# Patient Record
Sex: Male | Born: 1945 | Race: Black or African American | Hispanic: No | Marital: Married | Smoking: Never smoker
Health system: Southern US, Community
[De-identification: ages and names within clinical notes are randomized; demographics above are authoritative.]

## PROBLEM LIST (undated history)

## (undated) DIAGNOSIS — I10 Essential (primary) hypertension: Secondary | ICD-10-CM

## (undated) DIAGNOSIS — E119 Type 2 diabetes mellitus without complications: Secondary | ICD-10-CM

## (undated) DIAGNOSIS — N289 Disorder of kidney and ureter, unspecified: Secondary | ICD-10-CM

## (undated) HISTORY — PX: NO PAST SURGERIES: SHX2092

---

## 2008-08-30 ENCOUNTER — Ambulatory Visit: Payer: Self-pay | Admitting: Radiology

## 2008-08-30 ENCOUNTER — Emergency Department (HOSPITAL_BASED_OUTPATIENT_CLINIC_OR_DEPARTMENT_OTHER): Admission: EM | Admit: 2008-08-30 | Discharge: 2008-08-30 | Payer: Self-pay | Admitting: Emergency Medicine

## 2013-10-19 ENCOUNTER — Other Ambulatory Visit: Payer: Self-pay | Admitting: Internal Medicine

## 2013-10-19 ENCOUNTER — Ambulatory Visit
Admission: RE | Admit: 2013-10-19 | Discharge: 2013-10-19 | Disposition: A | Discharge status: Home / Self Care | Payer: Medicare Other | Source: Ambulatory Visit | Attending: Internal Medicine | Admitting: Internal Medicine

## 2013-10-19 DIAGNOSIS — R0602 Shortness of breath: Secondary | ICD-10-CM

## 2013-11-20 ENCOUNTER — Emergency Department (HOSPITAL_COMMUNITY): Payer: Medicare Other

## 2013-11-20 ENCOUNTER — Inpatient Hospital Stay (HOSPITAL_COMMUNITY)
Admission: EM | Admit: 2013-11-20 | Discharge: 2013-11-23 | DRG: 194 | Disposition: A | Discharge status: Home / Self Care | Payer: Medicare Other | Attending: Internal Medicine | Admitting: Internal Medicine

## 2013-11-20 ENCOUNTER — Encounter (HOSPITAL_COMMUNITY): Payer: Self-pay | Admitting: Emergency Medicine

## 2013-11-20 DIAGNOSIS — E119 Type 2 diabetes mellitus without complications: Secondary | ICD-10-CM | POA: Diagnosis present

## 2013-11-20 DIAGNOSIS — J189 Pneumonia, unspecified organism: Principal | ICD-10-CM

## 2013-11-20 DIAGNOSIS — Z7982 Long term (current) use of aspirin: Secondary | ICD-10-CM

## 2013-11-20 DIAGNOSIS — D649 Anemia, unspecified: Secondary | ICD-10-CM

## 2013-11-20 DIAGNOSIS — E785 Hyperlipidemia, unspecified: Secondary | ICD-10-CM | POA: Diagnosis present

## 2013-11-20 DIAGNOSIS — N19 Unspecified kidney failure: Secondary | ICD-10-CM

## 2013-11-20 DIAGNOSIS — N039 Chronic nephritic syndrome with unspecified morphologic changes: Secondary | ICD-10-CM

## 2013-11-20 DIAGNOSIS — I10 Essential (primary) hypertension: Secondary | ICD-10-CM

## 2013-11-20 DIAGNOSIS — D631 Anemia in chronic kidney disease: Secondary | ICD-10-CM | POA: Diagnosis present

## 2013-11-20 DIAGNOSIS — N185 Chronic kidney disease, stage 5: Secondary | ICD-10-CM | POA: Diagnosis present

## 2013-11-20 DIAGNOSIS — I120 Hypertensive chronic kidney disease with stage 5 chronic kidney disease or end stage renal disease: Secondary | ICD-10-CM | POA: Diagnosis present

## 2013-11-20 HISTORY — DX: Disorder of kidney and ureter, unspecified: N28.9

## 2013-11-20 HISTORY — DX: Essential (primary) hypertension: I10

## 2013-11-20 HISTORY — DX: Type 2 diabetes mellitus without complications: E11.9

## 2013-11-20 LAB — CBC WITH DIFFERENTIAL/PLATELET
Basophils Absolute: 0 10*3/uL (ref 0.0–0.1)
Basophils Relative: 0 % (ref 0–1)
EOS ABS: 0.1 10*3/uL (ref 0.0–0.7)
EOS PCT: 2 % (ref 0–5)
HEMATOCRIT: 25.6 % — AB (ref 39.0–52.0)
Hemoglobin: 8.2 g/dL — ABNORMAL LOW (ref 13.0–17.0)
LYMPHS ABS: 0.9 10*3/uL (ref 0.7–4.0)
LYMPHS PCT: 13 % (ref 12–46)
MCH: 31.5 pg (ref 26.0–34.0)
MCHC: 32 g/dL (ref 30.0–36.0)
MCV: 98.5 fL (ref 78.0–100.0)
MONO ABS: 0.8 10*3/uL (ref 0.1–1.0)
Monocytes Relative: 11 % (ref 3–12)
Neutro Abs: 5.3 10*3/uL (ref 1.7–7.7)
Neutrophils Relative %: 74 % (ref 43–77)
Platelets: 144 10*3/uL — ABNORMAL LOW (ref 150–400)
RBC: 2.6 MIL/uL — AB (ref 4.22–5.81)
RDW: 15.8 % — AB (ref 11.5–15.5)
WBC: 7.1 10*3/uL (ref 4.0–10.5)

## 2013-11-20 LAB — HEPATIC FUNCTION PANEL
ALBUMIN: 3.5 g/dL (ref 3.5–5.2)
ALT: 19 U/L (ref 0–53)
AST: 25 U/L (ref 0–37)
Alkaline Phosphatase: 39 U/L (ref 39–117)
BILIRUBIN TOTAL: 0.4 mg/dL (ref 0.3–1.2)
Bilirubin, Direct: <0.2 mg/dL (ref 0.0–0.3)
TOTAL PROTEIN: 6.8 g/dL (ref 6.0–8.3)

## 2013-11-20 LAB — URINALYSIS W MICROSCOPIC (NOT AT ARMC)
BILIRUBIN URINE: NEGATIVE
GLUCOSE, UA: NEGATIVE mg/dL
HGB URINE DIPSTICK: NEGATIVE
Ketones, ur: NEGATIVE mg/dL
Leukocytes, UA: NEGATIVE
Nitrite: NEGATIVE
PH: 5 (ref 5.0–8.0)
Protein, ur: 100 mg/dL — AB
SPECIFIC GRAVITY, URINE: 1.016 (ref 1.005–1.030)
Urobilinogen, UA: 0.2 mg/dL (ref 0.0–1.0)

## 2013-11-20 LAB — BASIC METABOLIC PANEL
Anion gap: 14 (ref 5–15)
BUN: 78 mg/dL — ABNORMAL HIGH (ref 6–23)
CALCIUM: 9.3 mg/dL (ref 8.4–10.5)
CO2: 20 meq/L (ref 19–32)
CREATININE: 5.16 mg/dL — AB (ref 0.50–1.35)
Chloride: 111 mEq/L (ref 96–112)
GFR calc Af Amer: 12 mL/min — ABNORMAL LOW (ref >90)
GFR calc non Af Amer: 10 mL/min — ABNORMAL LOW (ref >90)
GLUCOSE: 123 mg/dL — AB (ref 70–99)
Potassium: 5 mEq/L (ref 3.7–5.3)
Sodium: 145 mEq/L (ref 137–147)

## 2013-11-20 LAB — PROCALCITONIN: Procalcitonin: 0.12 ng/mL

## 2013-11-20 LAB — PRO B NATRIURETIC PEPTIDE: Pro B Natriuretic peptide (BNP): 9650 pg/mL — ABNORMAL HIGH (ref 0–125)

## 2013-11-20 LAB — I-STAT CG4 LACTIC ACID, ED: Lactic Acid, Venous: 0.54 mmol/L (ref 0.5–2.2)

## 2013-11-20 LAB — CK: CK TOTAL: 162 U/L (ref 7–232)

## 2013-11-20 LAB — TROPONIN I: Troponin I: <0.3 ng/mL (ref <0.30)

## 2013-11-20 LAB — LACTATE DEHYDROGENASE: LDH: 273 U/L — AB (ref 94–250)

## 2013-11-20 MED ORDER — DEXTROSE 5 % IV SOLN: 1.0000 g | Freq: Once | INTRAVENOUS | Status: DC

## 2013-11-20 MED ORDER — AMLODIPINE BESYLATE 10 MG PO TABS
10.0000 mg | ORAL_TABLET | Freq: Every day | ORAL | Status: DC
  Administered 2013-11-21 – 2013-11-23 (×3): 10 mg via ORAL
  Filled 2013-11-20 (×3): qty 1

## 2013-11-20 MED ORDER — ENOXAPARIN SODIUM 40 MG/0.4ML ~~LOC~~ SOLN
40.0000 mg | Freq: Every day | SUBCUTANEOUS | Status: DC
  Administered 2013-11-21 – 2013-11-22 (×3): 40 mg via SUBCUTANEOUS
  Filled 2013-11-20 (×4): qty 0.4

## 2013-11-20 MED ORDER — ACETAMINOPHEN 325 MG PO TABS: 650.0000 mg | ORAL_TABLET | Freq: Four times a day (QID) | ORAL | Status: DC | PRN

## 2013-11-20 MED ORDER — ATORVASTATIN CALCIUM 20 MG PO TABS
20.0000 mg | ORAL_TABLET | Freq: Every day | ORAL | Status: DC
  Administered 2013-11-21 – 2013-11-23 (×3): 20 mg via ORAL
  Filled 2013-11-20 (×3): qty 1

## 2013-11-20 MED ORDER — IPRATROPIUM BROMIDE 0.02 % IN SOLN
0.5000 mg | Freq: Once | RESPIRATORY_TRACT | Status: AC
  Administered 2013-11-20: 0.5 mg via RESPIRATORY_TRACT
  Filled 2013-11-20: qty 2.5

## 2013-11-20 MED ORDER — CLONIDINE HCL 0.3 MG PO TABS
0.3000 mg | ORAL_TABLET | Freq: Two times a day (BID) | ORAL | Status: DC
  Administered 2013-11-21 – 2013-11-23 (×6): 0.3 mg via ORAL
  Filled 2013-11-20 (×7): qty 1

## 2013-11-20 MED ORDER — SODIUM CHLORIDE 0.9 % IJ SOLN
3.0000 mL | Freq: Two times a day (BID) | INTRAMUSCULAR | Status: DC
Start: 2013-11-20 — End: 2013-11-23
  Administered 2013-11-20 – 2013-11-23 (×6): 3 mL via INTRAVENOUS

## 2013-11-20 MED ORDER — RENA-VITE PO TABS
1.0000 | ORAL_TABLET | Freq: Every day | ORAL | Status: DC
  Administered 2013-11-21 – 2013-11-23 (×3): 1 via ORAL
  Filled 2013-11-20 (×3): qty 1

## 2013-11-20 MED ORDER — INSULIN ASPART 100 UNIT/ML ~~LOC~~ SOLN
0.0000 [IU] | Freq: Three times a day (TID) | SUBCUTANEOUS | Status: DC
Start: 2013-11-21 — End: 2013-11-23
  Administered 2013-11-21 – 2013-11-22 (×3): 1 [IU] via SUBCUTANEOUS

## 2013-11-20 MED ORDER — ACETAMINOPHEN 650 MG RE SUPP: 650.0000 mg | Freq: Four times a day (QID) | RECTAL | Status: DC | PRN

## 2013-11-20 MED ORDER — LINAGLIPTIN 5 MG PO TABS
5.0000 mg | ORAL_TABLET | Freq: Every day | ORAL | Status: DC
  Administered 2013-11-21 – 2013-11-23 (×3): 5 mg via ORAL
  Filled 2013-11-20 (×3): qty 1

## 2013-11-20 MED ORDER — ALBUTEROL SULFATE (2.5 MG/3ML) 0.083% IN NEBU
5.0000 mg | INHALATION_SOLUTION | Freq: Once | RESPIRATORY_TRACT | Status: AC
  Administered 2013-11-20: 5 mg via RESPIRATORY_TRACT
  Filled 2013-11-20: qty 6

## 2013-11-20 MED ORDER — FUROSEMIDE 40 MG PO TABS
40.0000 mg | ORAL_TABLET | Freq: Two times a day (BID) | ORAL | Status: DC
  Administered 2013-11-21 (×2): 40 mg via ORAL
  Filled 2013-11-20 (×5): qty 1

## 2013-11-20 MED ORDER — SODIUM CHLORIDE 0.9 % IV BOLUS (SEPSIS): 1000.0000 mL | Freq: Once | INTRAVENOUS | Status: DC

## 2013-11-20 MED ORDER — LEVOFLOXACIN 500 MG PO TABS
500.0000 mg | ORAL_TABLET | Freq: Once | ORAL | Status: AC
  Administered 2013-11-20: 500 mg via ORAL
  Filled 2013-11-20: qty 1

## 2013-11-20 MED ORDER — SODIUM CHLORIDE 0.9 % IV SOLN
INTRAVENOUS | Status: DC
  Administered 2013-11-20: 23:00:00 via INTRAVENOUS

## 2013-11-20 MED ORDER — ASPIRIN 81 MG PO CHEW
81.0000 mg | CHEWABLE_TABLET | Freq: Every day | ORAL | Status: DC
  Administered 2013-11-21 – 2013-11-23 (×3): 81 mg via ORAL
  Filled 2013-11-20 (×3): qty 1

## 2013-11-20 MED ORDER — CARVEDILOL 25 MG PO TABS
50.0000 mg | ORAL_TABLET | Freq: Two times a day (BID) | ORAL | Status: DC
  Administered 2013-11-21 – 2013-11-23 (×6): 50 mg via ORAL
  Filled 2013-11-20 (×8): qty 2

## 2013-11-20 MED ORDER — ONDANSETRON HCL 4 MG/2ML IJ SOLN: 4.0000 mg | Freq: Four times a day (QID) | INTRAMUSCULAR | Status: DC | PRN

## 2013-11-20 MED ORDER — SODIUM CHLORIDE 0.9 % IV SOLN: INTRAVENOUS | Status: DC

## 2013-11-20 MED ORDER — ADULT MULTIVITAMIN W/MINERALS CH
1.0000 | ORAL_TABLET | Freq: Every day | ORAL | Status: DC
  Administered 2013-11-21 – 2013-11-23 (×3): 1 via ORAL
  Filled 2013-11-20 (×3): qty 1

## 2013-11-20 MED ORDER — LEVOFLOXACIN 250 MG PO TABS
250.0000 mg | ORAL_TABLET | ORAL | Status: DC
  Administered 2013-11-21 – 2013-11-23 (×3): 250 mg via ORAL
  Filled 2013-11-20 (×3): qty 1

## 2013-11-20 MED ORDER — ONDANSETRON HCL 4 MG PO TABS: 4.0000 mg | ORAL_TABLET | Freq: Four times a day (QID) | ORAL | Status: DC | PRN

## 2013-11-20 NOTE — ED Notes (Signed)
Pt states shortness of breath with exertion, cough, nasal congestion and chills since Monday.  Temp 99.8 today in triage.  Cough is nonproductive.  Pt denies pain/n/v on assessment.

## 2013-11-20 NOTE — ED Notes (Signed)
PA Greene at bedside. 

## 2013-11-20 NOTE — H&P (Signed)
[facility] History and Physical  Billy Aguilar [id number] DOB: 1945-06-09 DOA: 11/20/2013  Referring physician: ER physician. PCP: No primary provider on file. Dr. Othelia Pulling.  Chief Complaint: Cough and chest tightness.  HPI: Billy Aguilar is a 68 y.o. male history of chronic kidney disease stage V, anemia, diabetes mellitus, hypertension and hyperlipidemia has been experiencing upper respiratory tract infection-like symptoms over the last 4-5 days. Patient started having some nonproductive cough. He's been having some chest tightness of the last 2 days. He came to the ER and was found to have an x-ray showing possible atypical pneumonia versus fluid overload. On exam patient does not look to have any fluid overload. Patient's and is more consistent with possible pneumonia. Patient has been admitted for antibiotics. Patient's creatinine is around 5 and patient states his creatinine usually stays around 5. And hemoglobin around 8 which patient states is his baseline. Patient at this time denies any nausea vomiting abdominal pain. Has been having off and on fever chills.   Review of Systems: As presented in the history of presenting illness, rest negative.  Past Medical History  Diagnosis Date  . Hypertension   . Renal disorder   . Diabetes mellitus without complication    Past Surgical History  Procedure Laterality Date  . No past surgeries     Social History:  reports that he has never smoked. He has never used smokeless tobacco. He reports that he does not drink alcohol or use illicit drugs. Where does patient live home. Can patient participate in ADLs? Yes.  No Known Allergies  Family History:  Family History  Problem Relation Age of Onset  . Diabetes Mellitus II Other   . CAD Neg Hx       Prior to Admission medications   Medication Sig Start Date End Date Taking? Authorizing Provider  amLODipine (NORVASC) 10 MG tablet Take 10 mg by mouth daily.   Yes  Historical Provider, MD  aspirin 81 MG chewable tablet Chew 81 mg by mouth daily.   Yes Historical Provider, MD  atorvastatin (LIPITOR) 20 MG tablet Take 20 mg by mouth daily.   Yes Historical Provider, MD  benazepril (LOTENSIN) 40 MG tablet Take 40 mg by mouth daily.   Yes Historical Provider, MD  carvedilol (COREG) 25 MG tablet Take 50 mg by mouth 2 (two) times daily with a meal.   Yes Historical Provider, MD  cloNIDine (CATAPRES) 0.2 MG tablet Take 0.4 mg by mouth 2 (two) times daily.   Yes Historical Provider, MD  furosemide (LASIX) 40 MG tablet Take 40 mg by mouth 2 (two) times daily.   Yes Historical Provider, MD  linagliptin (TRADJENTA) 5 MG TABS tablet Take 5 mg by mouth daily.   Yes Historical Provider, MD  Multiple Vitamin (MULTIVITAMIN WITH MINERALS) TABS tablet Take 1 tablet by mouth daily.   Yes Historical Provider, MD  multivitamin (RENA-VIT) TABS tablet Take 1 tablet by mouth daily.   Yes Historical Provider, MD    Physical Exam: Filed Vitals:   11/20/13 1604  BP: 147/64  Pulse: 58  Temp: 99.8 F (37.7 C)  TempSrc: Oral  Resp: 18  SpO2: 96%     General:  Well-developed and nourished.  Eyes: Anicteric no pallor.  ENT: No discharge from the ears eyes nose mouth.  Neck: No mass felt. JVD are appreciated.  Cardiovascular: S1-S2 heard.  Respiratory: No rhonchi or crepitations.  Abdomen: Soft nontender bowel sounds present.  Skin: No rash.  Musculoskeletal: No edema.  Psychiatric: Appears normal.  Neurologic: Alert oriented to time place and person. Moves all extremities.  Labs on Admission:  Basic Metabolic Panel:  Recent Labs Lab 11/20/13 1929  NA 145  K 5.0  CL 111  CO2 20  GLUCOSE 123*  BUN 78*  CREATININE 5.16*  CALCIUM 9.3   Liver Function Tests: No results found for this basename: AST, ALT, ALKPHOS, BILITOT, PROT, ALBUMIN,  in the last 168 hours No results found for this basename: LIPASE, AMYLASE,  in the last 168 hours No results found  for this basename: AMMONIA,  in the last 168 hours CBC:  Recent Labs Lab 11/20/13 1929  WBC 7.1  NEUTROABS 5.3  HGB 8.2*  HCT 25.6*  MCV 98.5  PLT 144*   Cardiac Enzymes: No results found for this basename: CKTOTAL, CKMB, CKMBINDEX, TROPONINI,  in the last 168 hours  BNP (last 3 results)  Recent Labs  11/20/13 1929  PROBNP 9650.0*   CBG: No results found for this basename: GLUCAP,  in the last 168 hours  Radiological Exams on Admission: Dg Chest 2 View  11/20/2013   CLINICAL DATA:  Chest pain. Congestion. Cough. Shortness of breath. Hypertension.  EXAM: CHEST  2 VIEW  COMPARISON:  10/19/2013  FINDINGS: Stable moderate enlargement of the cardiopericardial silhouette. Tortuous thoracic aorta with atherosclerotic calcification of the arch.  Right perihilar airspace opacity. Equivocal left perihilar density. Upper zone pulmonary vascular prominence. No pleural effusion. Thoracic spondylosis noted.  IMPRESSION: 1. Right greater than left perihilar airspace opacities, differential diagnosis includes atypical pneumonia and mildly asymmetric pulmonary edema. 2. Stable moderate enlargement of the cardiopericardial silhouette.   Electronically Signed   By: Sherryl Barters M.D.   On: 11/20/2013 17:13    Assessment/Plan Active Problems:   Pneumonia   CKD (chronic kidney disease), stage V   HTN (hypertension)   Anemia   Diabetes mellitus type 2, controlled   1. Pneumonia - patient symptoms are more consistent with pneumonia rather than fluid overload. Patient has no peripheral edema and is lying on the bed flat. Patient has been placed on Levaquin. Check urine strep antigen and Legionella. Closely follow history status. 2. Chronic kidney disease stage V - patient states that he is already scheduled followup with transplant nephrologist. At this time I will continue patient's home medications except for ACE inhibitors. May try to discuss with patient's PCP in a.m. to check patient's  baseline creatinine. I have ordered one dose of IV Lasix. Closely follow intake output and metabolic panel. 3. Hypertension - see #2. 4. Diabetes mellitus type 2 - continue home medications with sliding-scale coverage. 5. Anemia probably from chronic kidney disease - patient states that he gets monthly injections of Epogen. He states his hemoglobin is usually on 8. 6. Hyperlipidemia - continue present medications.    Code Status: Full code.  Family Communication: Patient's daughter at the bedside.  Disposition Plan: Admit to inpatient.    Loann Chahal N. [facility] Pager [phone].  If 7PM-7AM, please contact night-coverage [email or web address] Password [email or web address] 11/20/2013, 9:07 PM

## 2013-11-20 NOTE — ED Provider Notes (Signed)
CSN: [id number]     Arrival date & time 11/20/13  1535 History   First MD Initiated Contact with Patient 11/20/13 1812     Chief Complaint  Patient presents with  . Nasal Congestion  . Cough     (Consider location/radiation/quality/duration/timing/severity/associated sxs/prior Treatment) HPI  Dr. Diona Foley (Family Practice- private)  Pt to the ER with complaints of myalgias, chest congestion, cough, nasal congestion and low grade fever over the past week. 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 He has not tried any medications at home or seen his PCP for this. He reports that his cough has been non productive and denies chest pain. Admits to a small amount of wheezing. NO nausea, vomiting, diarrhea, abdominal pain or dysuria. No recent fall or injury.   Past Medical History  Diagnosis Date  . Hypertension   . Renal disorder    History reviewed. No pertinent past surgical history. History reviewed. No pertinent family history. History  Substance Use Topics  . Smoking status: Never Smoker   . Smokeless tobacco: Not on file  . Alcohol Use: No    Review of Systems  Constitutional: Positive for fever and chills.  HENT: Positive for congestion.   Respiratory: Positive for cough and wheezing.   Musculoskeletal: Positive for myalgias.      Allergies  Review of patient's allergies indicates no known allergies.  Home Medications   Prior to Admission medications   Medication Sig Start Date End Date Taking? Authorizing Provider  amLODipine (NORVASC) 10 MG tablet Take 10 mg by mouth daily.   Yes Historical Provider, MD  aspirin 81 MG chewable tablet Chew 81 mg by mouth daily.   Yes Historical Provider, MD  atorvastatin (LIPITOR) 20 MG tablet Take 20 mg by mouth daily.   Yes Historical Provider, MD  benazepril (LOTENSIN) 40 MG tablet Take 40 mg by mouth daily.   Yes Historical Provider, MD  carvedilol  (COREG) 25 MG tablet Take 50 mg by mouth 2 (two) times daily with a meal.   Yes Historical Provider, MD  cloNIDine (CATAPRES) 0.2 MG tablet Take 0.4 mg by mouth 2 (two) times daily.   Yes Historical Provider, MD  furosemide (LASIX) 40 MG tablet Take 40 mg by mouth 2 (two) times daily.   Yes Historical Provider, MD  linagliptin (TRADJENTA) 5 MG TABS tablet Take 5 mg by mouth daily.   Yes Historical Provider, MD  Multiple Vitamin (MULTIVITAMIN WITH MINERALS) TABS tablet Take 1 tablet by mouth daily.   Yes Historical Provider, MD  multivitamin (RENA-VIT) TABS tablet Take 1 tablet by mouth daily.   Yes Historical Provider, MD   BP 147/64  Pulse 58  Temp(Src) 99.8 F (37.7 C) (Oral)  Resp 18  SpO2 96% Physical Exam  Nursing note and vitals reviewed. Constitutional: He appears well-developed and well-nourished. No distress.  HENT:  Head: Normocephalic and atraumatic.  Eyes: Pupils are equal, round, and reactive to light.  Neck: Normal range of motion. Neck supple.  Cardiovascular: Normal rate and regular rhythm.   Pulmonary/Chest: Effort normal. No accessory muscle usage. He has no decreased breath sounds. He has wheezes. He has rhonchi (RLL base). He has no rales. He exhibits no tenderness, no laceration, no edema and no retraction.  Abdominal: Soft.  Neurological: He is alert.  Skin: Skin is warm and dry.    ED Course  Procedures (including critical care time) Labs Review Labs  Reviewed  PRO B NATRIURETIC PEPTIDE - Abnormal; Notable for the following:    Pro B Natriuretic peptide (BNP) 9650.0 (*)    All other components within normal limits  CBC WITH DIFFERENTIAL - Abnormal; Notable for the following:    RBC 2.60 (*)    Hemoglobin 8.2 (*)    HCT 25.6 (*)    RDW 15.8 (*)    Platelets 144 (*)    All other components within normal limits  BASIC METABOLIC PANEL - Abnormal; Notable for the following:    Glucose, Bld 123 (*)    BUN 78 (*)    Creatinine, Ser 5.16 (*)    GFR calc non  Af Amer 10 (*)    GFR calc Af Amer 12 (*)    All other components within normal limits  I-STAT CG4 LACTIC ACID, ED    Imaging Review Dg Chest 2 View  11/20/2013   CLINICAL DATA:  Chest pain. Congestion. Cough. Shortness of breath. Hypertension.  EXAM: CHEST  2 VIEW  COMPARISON:  10/19/2013  FINDINGS: Stable moderate enlargement of the cardiopericardial silhouette. Tortuous thoracic aorta with atherosclerotic calcification of the arch.  Right perihilar airspace opacity. Equivocal left perihilar density. Upper zone pulmonary vascular prominence. No pleural effusion. Thoracic spondylosis noted.  IMPRESSION: 1. Right greater than left perihilar airspace opacities, differential diagnosis includes atypical pneumonia and mildly asymmetric pulmonary edema. 2. Stable moderate enlargement of the cardiopericardial silhouette.   Electronically Signed   By: Sherryl Barters M.D.   On: 11/20/2013 17:13     EKG Interpretation None      MDM   Final diagnoses:  Anemia, unspecified anemia type  Healthcare-associated pneumonia  Renal failure   7:30pm Pt has chest xray that shows concerns for pneumonia. He clinically looks like a pneumonia. Does not meet sepsis criteria at this time. He has not had labs prior to my evaluation therefore lactate, bnp, bmp, cbc have been added on for further evaluation. Will give Levaquin and fluids. If labs are not acute, anticipate patient will go home.  8:36 pm Patient has hemoglobin of 8.2 which she reports is close to his baseline of 8.6. He says that he has renal failure but has not needed dialysis and his primary care Dr. has never told him he was close to needing dialysis. Here in the ER his BUN is 78, creatinine 5.16 and kidney function is 10. Fortunately his primary care Dr. is in private practice we do not have normal values for this patient in regards to his kidney function. He does not know his baseline. Because of these reasons we will admit him for pneumonia and  renal failure.  Patient admitted to [facility], [facility], inpatient, tele  Linus Mako, PA-C 11/20/13 2045

## 2013-11-20 NOTE — ED Notes (Signed)
Attempted to start IV x 3 without success. PA aware. Pt will sip on water for now.

## 2013-11-20 NOTE — Progress Notes (Signed)
[place] asked to adjust Lovenox dose as necessary for VTE prophylaxis  Weight = 133.4 kg BMI = 39.88 CrCl = 19..6 ml/min  Assesment:  For CrCl < 30 ml/min with a BMI > 30, recommended Lovenox dose is 40mg  sq q24h  PLAN:  Change Lovenox to 40 mg sq q24h for VTE prophylaxis  Leone Haven, PharmD 11/20/13 @ 22:45

## 2013-11-20 NOTE — ED Notes (Signed)
IV team RN at bedside.  

## 2013-11-20 NOTE — Progress Notes (Signed)
ANTIBIOTIC CONSULT NOTE - INITIAL  Pharmacy Consult for Levaquin Indication: Community Acquired Pneumonia  No Known Allergies  Patient Measurements: Height: 6' (182.9 cm) Weight: 294 lb 3.2 oz (133.448 kg) IBW/kg (Calculated) : 77.6  Vital Signs: Temp: 98.7 F (37.1 C) (09/11 2213) Temp src: Oral (09/11 1604) BP: 159/68 mmHg (09/11 2213) Pulse Rate: 68 (09/11 2213) Intake/Output from previous day:   Intake/Output from this shift:    Labs:  Recent Labs  11/20/13 1929  WBC 7.1  HGB 8.2*  PLT 144*  CREATININE 5.16*   Estimated Creatinine Clearance: 19.6 ml/min (by C-G formula based on Cr of 5.16). No results found for this basename: VANCOTROUGH, VANCOPEAK, VANCORANDOM, GENTTROUGH, GENTPEAK, GENTRANDOM, TOBRATROUGH, TOBRAPEAK, TOBRARND, AMIKACINPEAK, AMIKACINTROU, AMIKACIN,  in the last 72 hours   Microbiology: No results found for this or any previous visit (from the past 720 hour(s)).  Medical History: Past Medical History  Diagnosis Date  . Hypertension   . Renal disorder   . Diabetes mellitus without complication     Medications:  Scheduled:  . sodium chloride   Intravenous STAT  . [START ON 11/21/2013] amLODipine  10 mg Oral Daily  . [START ON 11/21/2013] aspirin  81 mg Oral Daily  . [START ON 11/21/2013] atorvastatin  20 mg Oral Daily  . carvedilol  50 mg Oral BID WC  . cloNIDine  0.3 mg Oral BID  . enoxaparin (LOVENOX) injection  40 mg Subcutaneous QHS  . [START ON 11/21/2013] furosemide  40 mg Oral BID  . [START ON 11/21/2013] insulin aspart  0-9 Units Subcutaneous TID WC  . [START ON 11/21/2013] linagliptin  5 mg Oral Daily  . [START ON 11/21/2013] multivitamin  1 tablet Oral Daily  . [START ON 11/21/2013] multivitamin with minerals  1 tablet Oral Daily  . sodium chloride  3 mL Intravenous Q12H   Infusions:   Assessment:  69 yr male with DM, HTN and chronic kidney disease presents with SOB, cough, chills and nasal congestion  Tmax = 99.8 F  Chest  Xray concerning for pneumonia  Levaquin 500mg  po x 1 given in ED @ 19:55  CrCl ~ 20 ml/min  Goal of Therapy:  Eradication of infection  Plan:   Levaquin 500mg  po x 1 today (already given) followed by 250mg  po q24h  Follow renal function and adjust dosage as needed  Ellyana Crigler, Toribio Harbour, PharmD 11/20/2013,11:00 PM

## 2013-11-20 NOTE — ED Notes (Signed)
MD at bedside. Hospitalist at bedside. 

## 2013-11-20 NOTE — ED Notes (Signed)
RT called for neb tx.

## 2013-11-21 DIAGNOSIS — D649 Anemia, unspecified: Secondary | ICD-10-CM

## 2013-11-21 LAB — COMPREHENSIVE METABOLIC PANEL
ALK PHOS: 37 U/L — AB (ref 39–117)
ALT: 17 U/L (ref 0–53)
ANION GAP: 15 (ref 5–15)
AST: 18 U/L (ref 0–37)
Albumin: 3.3 g/dL — ABNORMAL LOW (ref 3.5–5.2)
BUN: 79 mg/dL — AB (ref 6–23)
CALCIUM: 9.2 mg/dL (ref 8.4–10.5)
CO2: 18 mEq/L — ABNORMAL LOW (ref 19–32)
Chloride: 107 mEq/L (ref 96–112)
Creatinine, Ser: 5.2 mg/dL — ABNORMAL HIGH (ref 0.50–1.35)
GFR calc non Af Amer: 10 mL/min — ABNORMAL LOW (ref >90)
GFR, EST AFRICAN AMERICAN: 12 mL/min — AB (ref >90)
GLUCOSE: 101 mg/dL — AB (ref 70–99)
POTASSIUM: 4.7 meq/L (ref 3.7–5.3)
SODIUM: 140 meq/L (ref 137–147)
TOTAL PROTEIN: 6.6 g/dL (ref 6.0–8.3)
Total Bilirubin: 0.5 mg/dL (ref 0.3–1.2)

## 2013-11-21 LAB — CBC WITH DIFFERENTIAL/PLATELET
Basophils Absolute: 0 10*3/uL (ref 0.0–0.1)
Basophils Relative: 0 % (ref 0–1)
Eosinophils Absolute: 0.1 10*3/uL (ref 0.0–0.7)
Eosinophils Relative: 2 % (ref 0–5)
HCT: 25.5 % — ABNORMAL LOW (ref 39.0–52.0)
Hemoglobin: 7.9 g/dL — ABNORMAL LOW (ref 13.0–17.0)
LYMPHS ABS: 0.9 10*3/uL (ref 0.7–4.0)
Lymphocytes Relative: 13 % (ref 12–46)
MCH: 30.7 pg (ref 26.0–34.0)
MCHC: 31 g/dL (ref 30.0–36.0)
MCV: 99.2 fL (ref 78.0–100.0)
MONO ABS: 0.8 10*3/uL (ref 0.1–1.0)
Monocytes Relative: 11 % (ref 3–12)
NEUTROS ABS: 5.6 10*3/uL (ref 1.7–7.7)
NEUTROS PCT: 74 % (ref 43–77)
Platelets: 146 10*3/uL — ABNORMAL LOW (ref 150–400)
RBC: 2.57 MIL/uL — AB (ref 4.22–5.81)
RDW: 15.9 % — ABNORMAL HIGH (ref 11.5–15.5)
WBC: 7.5 10*3/uL (ref 4.0–10.5)

## 2013-11-21 LAB — GLUCOSE, CAPILLARY
GLUCOSE-CAPILLARY: 135 mg/dL — AB (ref 70–99)
GLUCOSE-CAPILLARY: 131 mg/dL — AB (ref 70–99)
Glucose-Capillary: 102 mg/dL — ABNORMAL HIGH (ref 70–99)
Glucose-Capillary: 117 mg/dL — ABNORMAL HIGH (ref 70–99)

## 2013-11-21 LAB — SODIUM, URINE, RANDOM: SODIUM UR: 66 meq/L

## 2013-11-21 LAB — CREATININE, URINE, RANDOM: CREATININE, URINE: 121.3 mg/dL

## 2013-11-21 LAB — STREP PNEUMONIAE URINARY ANTIGEN: Strep Pneumo Urinary Antigen: NEGATIVE

## 2013-11-21 MED ORDER — HYDRALAZINE HCL 20 MG/ML IJ SOLN
10.0000 mg | INTRAMUSCULAR | Status: DC | PRN
  Administered 2013-11-21: 10 mg via INTRAVENOUS
  Filled 2013-11-21: qty 1

## 2013-11-21 MED ORDER — FUROSEMIDE 10 MG/ML IJ SOLN: 40.0000 mg | Freq: Once | INTRAMUSCULAR | Status: DC

## 2013-11-21 MED ORDER — LORAZEPAM 0.5 MG PO TABS
0.5000 mg | ORAL_TABLET | Freq: Once | ORAL | Status: AC
  Administered 2013-11-21: 0.5 mg via ORAL
  Filled 2013-11-21: qty 1

## 2013-11-21 NOTE — ED Provider Notes (Signed)
Medical screening examination/treatment/procedure(s) were performed by non-physician practitioner and as supervising physician I was immediately available for consultation/collaboration.   EKG Interpretation None       Jasper Riling. Alvino Chapel, MD 11/21/13 1541

## 2013-11-21 NOTE — Progress Notes (Signed)
[facility] PROGRESS NOTE  Billy Aguilar [id number] DOB: January 27, 1946 DOA: 11/20/2013 PCP: No primary provider on file.  Assessment/Plan: #1 community-acquired pneumonia Per chest x-ray and clinical symptoms. Patient states some improvement. Urine Legionella antigen pending. Urine pneumococcus antigen pending. Continue oxygen, nebulizers, Levaquin.  #2 chronic kidney disease stage V Patient states his baseline creatinine is approximately 5. Patient states he is making urine. Patient denies any shortness of breath. Currently in stable condition. Will follow. May need to followup with nephrology as outpatient.  #3 diabetes mellitus type 2 Discontinued tradjenta. Continue sliding scale insulin.  #4 anemia of chronic disease Patient's data hemoglobin is usually around 8. Stable. Follow.  #5 hyperlipidemia Continue Lipitor.  #6 prophylaxis Lovenox for DVT prophylaxis.  Code Status: Full Family Communication: Updated patient and family at bedside. Disposition Plan: Home when medically stable   Consultants:  None  Procedures:  Chest x-ray 11/20/2013  Antibiotics:  Levaquin 11/20/2013    HPI/Subjective: Patient states some improvement with his breathing.  Objective: Filed Vitals:   11/21/13 0528  BP: 178/63  Pulse: 64  Temp: 98.4 F (36.9 C)  Resp: 18    Intake/Output Summary (Last 24 hours) at 11/21/13 1142 Last data filed at 11/21/13 0745  Gross per 24 hour  Intake    210 ml  Output      0 ml  Net    210 ml   Filed Weights   11/20/13 2213 11/21/13 0528  Weight: 133.448 kg (294 lb 3.2 oz) 134.9 kg (297 lb 6.4 oz)    Exam:   General:  NAD  Cardiovascular: RRR  Respiratory: Some scattered coarse sounds.  Abdomen: Obese, soft, nontender, nondistended, positive bowel sounds.  Musculoskeletal: No clubbing cyanosis or edema  Data Reviewed: Basic Metabolic Panel:  Recent Labs Lab 11/20/13 1929 11/21/13 0522  NA 145 140  K 5.0 4.7   CL 111 107  CO2 20 18*  GLUCOSE 123* 101*  BUN 78* 79*  CREATININE 5.16* 5.20*  CALCIUM 9.3 9.2   Liver Function Tests:  Recent Labs Lab 11/20/13 1929 11/21/13 0522  AST 25 18  ALT 19 17  ALKPHOS 39 37*  BILITOT 0.4 0.5  PROT 6.8 6.6  ALBUMIN 3.5 3.3*   No results found for this basename: LIPASE, AMYLASE,  in the last 168 hours No results found for this basename: AMMONIA,  in the last 168 hours CBC:  Recent Labs Lab 11/20/13 1929 11/21/13 0522  WBC 7.1 7.5  NEUTROABS 5.3 5.6  HGB 8.2* 7.9*  HCT 25.6* 25.5*  MCV 98.5 99.2  PLT 144* 146*   Cardiac Enzymes:  Recent Labs Lab 11/20/13 1929  CKTOTAL 162  TROPONINI <0.30   BNP (last 3 results)  Recent Labs  11/20/13 1929  PROBNP 9650.0*   CBG:  Recent Labs Lab 11/21/13 0729 11/21/13 1125  GLUCAP 102* 117*    No results found for this or any previous visit (from the past 240 hour(s)).   Studies: Dg Chest 2 View  11/20/2013   CLINICAL DATA:  Chest pain. Congestion. Cough. Shortness of breath. Hypertension.  EXAM: CHEST  2 VIEW  COMPARISON:  10/19/2013  FINDINGS: Stable moderate enlargement of the cardiopericardial silhouette. Tortuous thoracic aorta with atherosclerotic calcification of the arch.  Right perihilar airspace opacity. Equivocal left perihilar density. Upper zone pulmonary vascular prominence. No pleural effusion. Thoracic spondylosis noted.  IMPRESSION: 1. Right greater than left perihilar airspace opacities, differential diagnosis includes atypical pneumonia and mildly asymmetric pulmonary edema. 2. Stable moderate enlargement of  the cardiopericardial silhouette.   Electronically Signed   By: Sherryl Barters M.D.   On: 11/20/2013 17:13    Scheduled Meds: . amLODipine  10 mg Oral Daily  . aspirin  81 mg Oral Daily  . atorvastatin  20 mg Oral Daily  . carvedilol  50 mg Oral BID WC  . cloNIDine  0.3 mg Oral BID  . enoxaparin (LOVENOX) injection  40 mg Subcutaneous QHS  . furosemide  40 mg  Intravenous Once  . furosemide  40 mg Oral BID  . insulin aspart  0-9 Units Subcutaneous TID WC  . levofloxacin  250 mg Oral Q24H  . linagliptin  5 mg Oral Daily  . multivitamin  1 tablet Oral Daily  . multivitamin with minerals  1 tablet Oral Daily  . sodium chloride  3 mL Intravenous Q12H   Continuous Infusions:   Principal Problem:   Pneumonia Active Problems:   CKD (chronic kidney disease), stage V   HTN (hypertension)   Anemia   Diabetes mellitus type 2, controlled    Time spent: 43 minutes    Clydette Privitera M.D. [facility] Pager [phone]. If 7PM-7AM, please contact night-coverage at [email or web address], password [facility] 11/21/2013, 11:42 AM  LOS: 1 day

## 2013-11-21 NOTE — Progress Notes (Signed)
Pt arrived to floor room 1518 via stretcher. Pt A+Ox4 . VS taken, pt oriented to room and callbell with no complaints. No complaint of pain. General weakness. Will continue to monitor throughout shift. Initial assessment completed.

## 2013-11-22 LAB — GLUCOSE, CAPILLARY
GLUCOSE-CAPILLARY: 102 mg/dL — AB (ref 70–99)
GLUCOSE-CAPILLARY: 135 mg/dL — AB (ref 70–99)
GLUCOSE-CAPILLARY: 131 mg/dL — AB (ref 70–99)
Glucose-Capillary: 126 mg/dL — ABNORMAL HIGH (ref 70–99)

## 2013-11-22 LAB — PROCALCITONIN: PROCALCITONIN: 0.3 ng/mL

## 2013-11-22 LAB — BASIC METABOLIC PANEL
Anion gap: 15 (ref 5–15)
BUN: 88 mg/dL — ABNORMAL HIGH (ref 6–23)
CALCIUM: 9.1 mg/dL (ref 8.4–10.5)
CO2: 19 mEq/L (ref 19–32)
CREATININE: 5.88 mg/dL — AB (ref 0.50–1.35)
Chloride: 107 mEq/L (ref 96–112)
GFR calc Af Amer: 10 mL/min — ABNORMAL LOW (ref >90)
GFR, EST NON AFRICAN AMERICAN: 9 mL/min — AB (ref >90)
Glucose, Bld: 108 mg/dL — ABNORMAL HIGH (ref 70–99)
Potassium: 4.9 mEq/L (ref 3.7–5.3)
SODIUM: 141 meq/L (ref 137–147)

## 2013-11-22 LAB — LEGIONELLA ANTIGEN, URINE: Legionella Antigen, Urine: NEGATIVE

## 2013-11-22 LAB — CBC
HCT: 23.4 % — ABNORMAL LOW (ref 39.0–52.0)
Hemoglobin: 7.6 g/dL — ABNORMAL LOW (ref 13.0–17.0)
MCH: 31.8 pg (ref 26.0–34.0)
MCHC: 32.5 g/dL (ref 30.0–36.0)
MCV: 97.9 fL (ref 78.0–100.0)
PLATELETS: 136 10*3/uL — AB (ref 150–400)
RBC: 2.39 MIL/uL — ABNORMAL LOW (ref 4.22–5.81)
RDW: 15.4 % (ref 11.5–15.5)
WBC: 5.6 10*3/uL (ref 4.0–10.5)

## 2013-11-22 MED ORDER — ALBUTEROL SULFATE (2.5 MG/3ML) 0.083% IN NEBU
2.5000 mg | INHALATION_SOLUTION | Freq: Four times a day (QID) | RESPIRATORY_TRACT | Status: DC
  Administered 2013-11-22 (×2): 2.5 mg via RESPIRATORY_TRACT
  Filled 2013-11-22 (×2): qty 3

## 2013-11-22 MED ORDER — ALBUTEROL SULFATE (2.5 MG/3ML) 0.083% IN NEBU: 2.5000 mg | INHALATION_SOLUTION | RESPIRATORY_TRACT | Status: DC | PRN

## 2013-11-22 MED ORDER — ALBUTEROL SULFATE (2.5 MG/3ML) 0.083% IN NEBU
2.5000 mg | INHALATION_SOLUTION | Freq: Three times a day (TID) | RESPIRATORY_TRACT | Status: DC
  Administered 2013-11-23: 2.5 mg via RESPIRATORY_TRACT
  Filled 2013-11-22 (×2): qty 3

## 2013-11-22 NOTE — Progress Notes (Signed)
Ambulated the length of hallway, O2 sats 96% at rest then down to 90% while ambulating. Some dyspnea noted. O2 sats returned to 96 % while resting.

## 2013-11-22 NOTE — Progress Notes (Signed)
[facility] PROGRESS NOTE  Billy Aguilar [id number] DOB: Jun 13, 1945 DOA: 11/20/2013 PCP: No primary provider on file.  Assessment/Plan: #1 community-acquired pneumonia Per chest x-ray and clinical symptoms. Patient states improvement. Urine Legionella antigen negative.  Urine pneumococcus antigen negative. Continue oxygen, nebulizers, Levaquin.  #2 chronic kidney disease stage V Patient states his baseline creatinine is approximately 5. Patient states he is making urine. Patient denies any shortness of breath. Currently in stable condition. Slight increase in creatinine however GFR is unchanged. Will hold Lasix today. Followup with nephrology as outpatient.  #3 diabetes mellitus type 2 Discontinued tradjenta. Continue sliding scale insulin.  #4 anemia of chronic disease Patient's data hemoglobin is usually around 8. Stable. Follow.  #5 hyperlipidemia Continue Lipitor.  #6 prophylaxis Lovenox for DVT prophylaxis.  Code Status: Full Family Communication: Updated patient and family at bedside. Disposition Plan: Home when medically stable   Consultants:  None  Procedures:  Chest x-ray 11/20/2013  Antibiotics:  Levaquin 11/20/2013    HPI/Subjective: Patient states improvement with his breathing.  Objective: Filed Vitals:   11/22/13 0600  BP: 163/64  Pulse: 63  Temp: 98.6 F (37 C)  Resp: 18    Intake/Output Summary (Last 24 hours) at 11/22/13 1136 Last data filed at 11/21/13 1700  Gross per 24 hour  Intake    460 ml  Output      0 ml  Net    460 ml   Filed Weights   11/20/13 2213 11/21/13 0528  Weight: 133.448 kg (294 lb 3.2 oz) 134.9 kg (297 lb 6.4 oz)    Exam:   General:  NAD  Cardiovascular: RRR  Respiratory: Some scattered coarse sounds.Minimal expiratory wheezing  Abdomen: Obese, soft, nontender, nondistended, positive bowel sounds.  Musculoskeletal: No clubbing cyanosis or edema  Data Reviewed: Basic Metabolic  Panel:  Recent Labs Lab 11/20/13 1929 11/21/13 0522 11/22/13 0555  NA 145 140 141  K 5.0 4.7 4.9  CL 111 107 107  CO2 20 18* 19  GLUCOSE 123* 101* 108*  BUN 78* 79* 88*  CREATININE 5.16* 5.20* 5.88*  CALCIUM 9.3 9.2 9.1   Liver Function Tests:  Recent Labs Lab 11/20/13 1929 11/21/13 0522  AST 25 18  ALT 19 17  ALKPHOS 39 37*  BILITOT 0.4 0.5  PROT 6.8 6.6  ALBUMIN 3.5 3.3*   No results found for this basename: LIPASE, AMYLASE,  in the last 168 hours No results found for this basename: AMMONIA,  in the last 168 hours CBC:  Recent Labs Lab 11/20/13 1929 11/21/13 0522 11/22/13 0555  WBC 7.1 7.5 5.6  NEUTROABS 5.3 5.6  --   HGB 8.2* 7.9* 7.6*  HCT 25.6* 25.5* 23.4*  MCV 98.5 99.2 97.9  PLT 144* 146* 136*   Cardiac Enzymes:  Recent Labs Lab 11/20/13 1929  CKTOTAL 162  TROPONINI <0.30   BNP (last 3 results)  Recent Labs  11/20/13 1929  PROBNP 9650.0*   CBG:  Recent Labs Lab 11/21/13 1125 11/21/13 1637 11/21/13 2141 11/22/13 0700 11/22/13 1121  GLUCAP 117* 135* 131* 102* 135*    No results found for this or any previous visit (from the past 240 hour(s)).   Studies: Dg Chest 2 View  11/20/2013   CLINICAL DATA:  Chest pain. Congestion. Cough. Shortness of breath. Hypertension.  EXAM: CHEST  2 VIEW  COMPARISON:  10/19/2013  FINDINGS: Stable moderate enlargement of the cardiopericardial silhouette. Tortuous thoracic aorta with atherosclerotic calcification of the arch.  Right perihilar airspace opacity. Equivocal left perihilar  density. Upper zone pulmonary vascular prominence. No pleural effusion. Thoracic spondylosis noted.  IMPRESSION: 1. Right greater than left perihilar airspace opacities, differential diagnosis includes atypical pneumonia and mildly asymmetric pulmonary edema. 2. Stable moderate enlargement of the cardiopericardial silhouette.   Electronically Signed   By: Sherryl Barters M.D.   On: 11/20/2013 17:13    Scheduled Meds: .  amLODipine  10 mg Oral Daily  . aspirin  81 mg Oral Daily  . atorvastatin  20 mg Oral Daily  . carvedilol  50 mg Oral BID WC  . cloNIDine  0.3 mg Oral BID  . enoxaparin (LOVENOX) injection  40 mg Subcutaneous QHS  . furosemide  40 mg Intravenous Once  . insulin aspart  0-9 Units Subcutaneous TID WC  . levofloxacin  250 mg Oral Q24H  . linagliptin  5 mg Oral Daily  . multivitamin  1 tablet Oral Daily  . multivitamin with minerals  1 tablet Oral Daily  . sodium chloride  3 mL Intravenous Q12H   Continuous Infusions:   Principal Problem:   Pneumonia Active Problems:   CKD (chronic kidney disease), stage V   HTN (hypertension)   Anemia   Diabetes mellitus type 2, controlled    Time spent: 32 minutes    THOMPSON,DANIEL M.D. [facility] Pager [phone]. If 7PM-7AM, please contact night-coverage at [email or web address], password [facility] 11/22/2013, 11:36 AM  LOS: 2 days

## 2013-11-23 LAB — CBC
HCT: 23.3 % — ABNORMAL LOW (ref 39.0–52.0)
Hemoglobin: 7.6 g/dL — ABNORMAL LOW (ref 13.0–17.0)
MCH: 31 pg (ref 26.0–34.0)
MCHC: 32.6 g/dL (ref 30.0–36.0)
MCV: 95.1 fL (ref 78.0–100.0)
PLATELETS: 153 10*3/uL (ref 150–400)
RBC: 2.45 MIL/uL — ABNORMAL LOW (ref 4.22–5.81)
RDW: 15.2 % (ref 11.5–15.5)
WBC: 4.9 10*3/uL (ref 4.0–10.5)

## 2013-11-23 LAB — BASIC METABOLIC PANEL
Anion gap: 16 — ABNORMAL HIGH (ref 5–15)
BUN: 93 mg/dL — AB (ref 6–23)
CO2: 18 mEq/L — ABNORMAL LOW (ref 19–32)
CREATININE: 5.79 mg/dL — AB (ref 0.50–1.35)
Calcium: 9.1 mg/dL (ref 8.4–10.5)
Chloride: 106 mEq/L (ref 96–112)
GFR, EST AFRICAN AMERICAN: 11 mL/min — AB (ref >90)
GFR, EST NON AFRICAN AMERICAN: 9 mL/min — AB (ref >90)
GLUCOSE: 95 mg/dL (ref 70–99)
POTASSIUM: 4.6 meq/L (ref 3.7–5.3)
Sodium: 140 mEq/L (ref 137–147)

## 2013-11-23 LAB — GLUCOSE, CAPILLARY
Glucose-Capillary: 93 mg/dL (ref 70–99)
Glucose-Capillary: 102 mg/dL — ABNORMAL HIGH (ref 70–99)

## 2013-11-23 MED ORDER — FUROSEMIDE 40 MG PO TABS
40.0000 mg | ORAL_TABLET | Freq: Two times a day (BID) | ORAL | Status: DC
  Administered 2013-11-23: 40 mg via ORAL
  Filled 2013-11-23 (×3): qty 1

## 2013-11-23 MED ORDER — ALBUTEROL SULFATE HFA 108 (90 BASE) MCG/ACT IN AERS: 2.0000 | INHALATION_SPRAY | Freq: Four times a day (QID) | RESPIRATORY_TRACT | Status: AC | PRN

## 2013-11-23 MED ORDER — LEVOFLOXACIN 250 MG PO TABS: 250.0000 mg | ORAL_TABLET | ORAL | Status: AC

## 2013-11-23 NOTE — Progress Notes (Signed)
ANTIBIOTIC CONSULT NOTE - Follow-Up  Pharmacy Consult for Levaquin Indication: Community Acquired Pneumonia  No Known Allergies  Patient Measurements: Height: 6' (182.9 cm) Weight: 295 lb 1.6 oz (133.856 kg) IBW/kg (Calculated) : 77.6  Vital Signs: Temp: 98 F (36.7 C) (09/14 0545) Temp src: Oral (09/14 0545) BP: 155/64 mmHg (09/14 0545) Pulse Rate: 64 (09/14 0545) Intake/Output from previous day: 09/13 0701 - 09/14 0700 In: 740 [P.O.:740] Out: -  Intake/Output from this shift: Total I/O In: 240 [P.O.:240] Out: -   Labs:  Recent Labs  11/20/13 1929 11/20/13 2116 11/21/13 0522 11/22/13 0555 11/23/13 0521  WBC 7.1  --  7.5 5.6 4.9  HGB 8.2*  --  7.9* 7.6* 7.6*  PLT 144*  --  146* 136* 153  LABCREA  --  121.3  --   --   --   CREATININE 5.16*  --  5.20* 5.88* 5.79*   Estimated Creatinine Clearance: 17.5 ml/min (by C-G formula based on Cr of 5.79).    Microbiology: No results found for this or any previous visit (from the past 720 hour(s)).  Medical History: Past Medical History  Diagnosis Date  . Hypertension   . Renal disorder   . Diabetes mellitus without complication     Medications:  Scheduled:  . albuterol  2.5 mg Nebulization TID  . amLODipine  10 mg Oral Daily  . aspirin  81 mg Oral Daily  . atorvastatin  20 mg Oral Daily  . carvedilol  50 mg Oral BID WC  . cloNIDine  0.3 mg Oral BID  . enoxaparin (LOVENOX) injection  40 mg Subcutaneous QHS  . furosemide  40 mg Intravenous Once  . furosemide  40 mg Oral BID  . insulin aspart  0-9 Units Subcutaneous TID WC  . levofloxacin  250 mg Oral Q24H  . linagliptin  5 mg Oral Daily  . multivitamin  1 tablet Oral Daily  . multivitamin with minerals  1 tablet Oral Daily  . sodium chloride  3 mL Intravenous Q12H   Infusions:    Assessment: 68 yr male with DM, HTN and chronic kidney disease presented with SOB, cough, chills and nasal congestion.  CXR was concerning for pneumonia and low grade T was  noted.  Orders received to begin Levaquin for pneumonia.  Leavaquin 500mg  PO x 1 was administered in ED at 19:55.  Hx of CKD noted (baseline SCr ~ 5).  Goal of Therapy:  Appropriate antibiotic dosing for renal function; eradication of infection.  9/14:  D#4 Levaquin 500 mg PO x 1 then 250 mg PO q24h for pneumonia  Afebrile  WBC WNL  SCr slightly increased  No culture data.   Legionella and pneumococcal urine antigens negative.  Plan:  1.  Continue Levaquin 250 mg PO q24h 2.  Follow SCr, clinical course, any culture data, duration of therapy.  Clayburn Pert, PharmD, BCPS Pager: [phone] 11/23/2013  11:40 AM

## 2013-11-23 NOTE — Progress Notes (Signed)
Patient given discharge instructions, and verbalized an understanding of all discharge instructions.  Patient agrees with discharge plan, and is being discharged in stable medical condition.  Patient given transportation via wheelchair.  Malyk Girouard RN 

## 2013-11-23 NOTE — Discharge Summary (Signed)
Physician Discharge Summary  Billy Aguilar [id number] DOB: August 13, 1945 DOA: 11/20/2013  PCP: Jilda Panda, MD  Admit date: 11/20/2013 Discharge date: 11/23/2013  Time spent: 65 minutes  Recommendations for Outpatient Follow-up:  1. Followup with Jilda Panda, MD in 1 week. On followup patient will need a basic metabolic profile done to followup on electrolytes and renal function. Patient also need a CBC done to followup on H&H. 2. Followup with nephrologist as previously scheduled.  Discharge Diagnoses:  Principal Problem:   Pneumonia Active Problems:   CKD (chronic kidney disease), stage V   HTN (hypertension)   Anemia   Diabetes mellitus type 2, controlled   Discharge Condition: stable and improved.  Diet recommendation: Carb modified  Filed Weights   11/20/13 2213 11/21/13 0528 11/23/13 0545  Weight: 133.448 kg (294 lb 3.2 oz) 134.9 kg (297 lb 6.4 oz) 133.856 kg (295 lb 1.6 oz)    History of present illness:  Billy Aguilar is a 68 y.o. male history of chronic kidney disease stage V, anemia, diabetes mellitus, hypertension and hyperlipidemia who had been experiencing upper respiratory tract infection-like symptoms over the last 4-5 days prior to admission. Patient started having some nonproductive cough. He's been having some chest tightness for the last 2 days prior to admission. He came to the ER and was found to have an x-ray showing possible atypical pneumonia versus fluid overload. On exam patient does not look to have any fluid overload. Patient's symptoms were more consistent with possible pneumonia. Patient had been admitted for antibiotics. Patient's creatinine is around 5 and patient stated his creatinine usually stays around 5. And hemoglobin around 8 which patient states is his baseline. Patient at this time denied any nausea, vomiting, abdominal pain. Has been having off and on fever chills.    Hospital Course:  #1 community-acquired pneumonia  Patient had  presented with upper rest for his symptoms been ongoing for 4-5 days prior to admission with a nonproductive cough. Patient did have some chest tightness. Chest x-ray which was done was worrisome for atypical pneumonia versus volume overload. Patient did not look volume overloaded and patient was subsequently admitted to a MedSurg floor. Patient was started empirically on IV Levaquin and subsequently transitioned to oral Levaquin. Urine pneumococcus antigen which was done was negative.  Urine Legionella antigen which was done was also negative.patient improved clinically. Patient was put on oxygen and nebulizer treatments. Patient be discharged home on 4 more days of oral Levaquin to complete a day course of antibiotic therapy.  #2 chronic kidney disease stage V  Patient stated his baseline creatinine is approximately 5. Patient stated he is making urine. Patient denied any shortness of breath. Patient's renal function essentially remained stable throughout the hospitalization. Patient did have a slight bump in his creatinine however his GFR is unchanged. Patient's Lasix was held for a day and resumed on day of discharge. Patient is to followup with his nephrologist as previously scheduled. Currently in stable condition. Slight increase in creatinine however GFR is unchanged. Will hold Lasix today. Followup with nephrology as outpatient.  #3 diabetes mellitus type 2  Discontinued tradjenta during patient's hospitalization. Patient was maintained on sliding scale insulin.  #4 anemia of chronic disease  Patient's stated hemoglobin is usually around 8. Patient did not have any overt bleeding during the hospitalization. Patient's hemoglobin was 7.6 on discharge. Patient is to followup with his nephrologist next week.  #5 hyperlipidemia  Continued on home regimen Lipitor.   Procedures: Chest x-ray 11/20/2013   Consultations:  None  Discharge Exam: Filed Vitals:   11/23/13 1401  BP: 148/52  Pulse: 58   Temp: 97.8 F (36.6 C)  Resp: 18    General: NAD Cardiovascular: RRR Respiratory: CTAB  Discharge Instructions You were cared for by a hospitalist during your hospital stay. If you have any questions about your discharge medications or the care you received while you were in the hospital after you are discharged, you can call the unit and asked to speak with the hospitalist on call if the hospitalist that took care of you is not available. Once you are discharged, your primary care physician will handle any further medical issues. Please note that NO REFILLS for any discharge medications will be authorized once you are discharged, as it is imperative that you return to your primary care physician (or establish a relationship with a primary care physician if you do not have one) for your aftercare needs so that they can reassess your need for medications and monitor your lab values.  Discharge Instructions   Diet Carb Modified    Complete by:  As directed      Discharge instructions    Complete by:  As directed   Follow up with Jilda Panda, MD in 1 week.     Increase activity slowly    Complete by:  As directed           Current Discharge Medication List    START taking these medications   Details  albuterol (PROVENTIL HFA;VENTOLIN HFA) 108 (90 BASE) MCG/ACT inhaler Inhale 2 puffs into the lungs every 6 (six) hours as needed for wheezing or shortness of breath. Use 2 puffs 3 times daily x 4 days then may use every 6 hours as needed for wheezing or SOB. Qty: 8.5 g, Refills: 0    levofloxacin (LEVAQUIN) 250 MG tablet Take 1 tablet (250 mg total) by mouth daily. Take for 4 days then stop. Qty: 4 tablet, Refills: 0      CONTINUE these medications which have NOT CHANGED   Details  amLODipine (NORVASC) 10 MG tablet Take 10 mg by mouth daily.    aspirin 81 MG chewable tablet Chew 81 mg by mouth daily.    atorvastatin (LIPITOR) 20 MG tablet Take 20 mg by mouth daily.    benazepril  (LOTENSIN) 40 MG tablet Take 40 mg by mouth daily.    carvedilol (COREG) 25 MG tablet Take 50 mg by mouth 2 (two) times daily with a meal.    cloNIDine (CATAPRES) 0.2 MG tablet Take 0.4 mg by mouth 2 (two) times daily.    furosemide (LASIX) 40 MG tablet Take 40 mg by mouth 2 (two) times daily.    linagliptin (TRADJENTA) 5 MG TABS tablet Take 5 mg by mouth daily.    Multiple Vitamin (MULTIVITAMIN WITH MINERALS) TABS tablet Take 1 tablet by mouth daily.    multivitamin (RENA-VIT) TABS tablet Take 1 tablet by mouth daily.       No Known Allergies Follow-up Information   Follow up with MOREIRA,ROY, MD. Schedule an appointment as soon as possible for a visit in 1 week.   Specialty:  Internal Medicine   Contact information:   [address] [place] [address] [phone]        The results of significant diagnostics from this hospitalization (including imaging, microbiology, ancillary and laboratory) are listed below for reference.    Significant Diagnostic Studies: Dg Chest 2 View  11/20/2013   CLINICAL DATA:  Chest pain. Congestion. Cough.  Shortness of breath. Hypertension.  EXAM: CHEST  2 VIEW  COMPARISON:  10/19/2013  FINDINGS: Stable moderate enlargement of the cardiopericardial silhouette. Tortuous thoracic aorta with atherosclerotic calcification of the arch.  Right perihilar airspace opacity. Equivocal left perihilar density. Upper zone pulmonary vascular prominence. No pleural effusion. Thoracic spondylosis noted.  IMPRESSION: 1. Right greater than left perihilar airspace opacities, differential diagnosis includes atypical pneumonia and mildly asymmetric pulmonary edema. 2. Stable moderate enlargement of the cardiopericardial silhouette.   Electronically Signed   By: Sherryl Barters M.D.   On: 11/20/2013 17:13    Microbiology: No results found for this or any previous visit (from the past 240 hour(s)).   Labs: Basic Metabolic Panel:  Recent Labs Lab 11/20/13 1929  11/21/13 0522 11/22/13 0555 11/23/13 0521  NA 145 140 141 140  K 5.0 4.7 4.9 4.6  CL 111 107 107 106  CO2 20 18* 19 18*  GLUCOSE 123* 101* 108* 95  BUN 78* 79* 88* 93*  CREATININE 5.16* 5.20* 5.88* 5.79*  CALCIUM 9.3 9.2 9.1 9.1   Liver Function Tests:  Recent Labs Lab 11/20/13 1929 11/21/13 0522  AST 25 18  ALT 19 17  ALKPHOS 39 37*  BILITOT 0.4 0.5  PROT 6.8 6.6  ALBUMIN 3.5 3.3*   No results found for this basename: LIPASE, AMYLASE,  in the last 168 hours No results found for this basename: AMMONIA,  in the last 168 hours CBC:  Recent Labs Lab 11/20/13 1929 11/21/13 0522 11/22/13 0555 11/23/13 0521  WBC 7.1 7.5 5.6 4.9  NEUTROABS 5.3 5.6  --   --   HGB 8.2* 7.9* 7.6* 7.6*  HCT 25.6* 25.5* 23.4* 23.3*  MCV 98.5 99.2 97.9 95.1  PLT 144* 146* 136* 153   Cardiac Enzymes:  Recent Labs Lab 11/20/13 [place] 162  TROPONINI <0.30   BNP: BNP (last 3 results)  Recent Labs  11/20/13 1929  PROBNP 9650.0*   CBG:  Recent Labs Lab 11/22/13 1121 11/22/13 1645 11/22/13 2128 11/23/13 0731 11/23/13 1142  GLUCAP 135* 131* 126* 93 102*       Signed:  Yaxiel Minnie MD [facility] 11/23/2013, 2:19 PM

## 2013-11-23 NOTE — Care Management Note (Signed)
    Page 1 of 1   11/23/2013     3:59:14 PM CARE MANAGEMENT NOTE 11/23/2013  Patient:  Billy Aguilar, Billy Aguilar   Account Number:  [id number]  Date Initiated:  11/23/2013  Documentation initiated by:  Allene Dillon  Subjective/Objective Assessment:   68 year old male admitted with CAP.     Action/Plan:   From home.   Anticipated DC Date:  11/23/2013   Anticipated DC Plan:  [place]  CM consult      Choice offered to / List presented to:             Status of service:  Completed, signed off Medicare Important Message given?  YES (If response is "NO", the following Medicare IM given date fields will be blank) Date Medicare IM given:  11/23/2013 Medicare IM given by:  [facility] Date Additional Medicare IM given:   Additional Medicare IM given by:    Discharge Disposition:    Per UR Regulation:  Reviewed for med. necessity/level of care/duration of stay  If discussed at [place] of Stay Meetings, dates discussed:    Comments:

## 2014-06-22 ENCOUNTER — Other Ambulatory Visit: Payer: Self-pay | Admitting: Internal Medicine

## 2014-06-22 ENCOUNTER — Ambulatory Visit
Admission: RE | Admit: 2014-06-22 | Discharge: 2014-06-22 | Disposition: A | Discharge status: Home / Self Care | Payer: Medicare Other | Source: Ambulatory Visit | Attending: Internal Medicine | Admitting: Internal Medicine

## 2014-06-22 DIAGNOSIS — R062 Wheezing: Secondary | ICD-10-CM

## 2014-09-22 ENCOUNTER — Other Ambulatory Visit: Payer: Medicare Other

## 2014-09-22 ENCOUNTER — Other Ambulatory Visit: Payer: Self-pay | Admitting: Internal Medicine

## 2014-09-22 DIAGNOSIS — R319 Hematuria, unspecified: Secondary | ICD-10-CM

## 2019-05-16 ENCOUNTER — Ambulatory Visit: Payer: Medicare Other | Attending: Internal Medicine

## 2019-05-16 DIAGNOSIS — Z23 Encounter for immunization: Secondary | ICD-10-CM

## 2019-05-16 NOTE — Progress Notes (Signed)
   Covid-19 Vaccination Clinic  Name:  Rajvir Ernster    MRN: [id number] DOB: [birth year]  05/16/2019  Mr. Wolfson was observed post Covid-19 immunization for 15 minutes without incident. He was provided with Vaccine Information Sheet and instruction to access the V-Safe system.   Mr. Blue was instructed to call 911 with any severe reactions post vaccine: Marland Kitchen Difficulty breathing  . Swelling of face and throat  . A fast heartbeat  . A bad rash all over body  . Dizziness and weakness   Immunizations Administered    Name Date Dose VIS Date Route   Pfizer COVID-19 Vaccine 05/16/2019  3:53 PM 0.3 mL 02/20/2019 Intramuscular   Manufacturer: [place]   Lot: [id number]   [place]: [id number]

## 2019-06-06 ENCOUNTER — Ambulatory Visit: Payer: Medicare Other | Attending: Internal Medicine

## 2019-06-06 ENCOUNTER — Ambulatory Visit: Payer: Medicare Other

## 2019-06-06 DIAGNOSIS — Z23 Encounter for immunization: Secondary | ICD-10-CM

## 2019-06-06 NOTE — Progress Notes (Signed)
   Covid-19 Vaccination Clinic  Name:  Billy Aguilar    MRN: [id number] DOB: 08/03/1945  06/06/2019  [name] was observed post Covid-19 immunization for 15 minutes without incident. He was provided with Vaccine Information Sheet and instruction to access the V-Safe system.   [name] was instructed to call 911 with any severe reactions post vaccine: Marland Kitchen Difficulty breathing  . Swelling of face and throat  . A fast heartbeat  . A bad rash all over body  . Dizziness and weakness   Immunizations Administered    Name Date Dose VIS Date Route   Pfizer COVID-19 Vaccine 06/06/2019  1:00 PM 0.3 mL 02/20/2019 Intramuscular   Manufacturer: [place]   Lot: [id number]   [place]: [address]-5

## 2019-06-16 ENCOUNTER — Ambulatory Visit: Payer: Medicare Other

## 2020-02-17 ENCOUNTER — Emergency Department (HOSPITAL_COMMUNITY): Payer: Medicare Other

## 2020-02-17 ENCOUNTER — Emergency Department (HOSPITAL_COMMUNITY)
Admission: EM | Admit: 2020-02-17 | Discharge: 2020-02-17 | Disposition: A | Discharge status: Home / Self Care | Payer: Medicare Other | Attending: Emergency Medicine | Admitting: Emergency Medicine

## 2020-02-17 DIAGNOSIS — E1122 Type 2 diabetes mellitus with diabetic chronic kidney disease: Secondary | ICD-10-CM | POA: Insufficient documentation

## 2020-02-17 DIAGNOSIS — R0789 Other chest pain: Secondary | ICD-10-CM | POA: Diagnosis not present

## 2020-02-17 DIAGNOSIS — Z7982 Long term (current) use of aspirin: Secondary | ICD-10-CM | POA: Insufficient documentation

## 2020-02-17 DIAGNOSIS — Y92410 Unspecified street and highway as the place of occurrence of the external cause: Secondary | ICD-10-CM | POA: Diagnosis not present

## 2020-02-17 DIAGNOSIS — M542 Cervicalgia: Secondary | ICD-10-CM | POA: Diagnosis not present

## 2020-02-17 DIAGNOSIS — Z79899 Other long term (current) drug therapy: Secondary | ICD-10-CM | POA: Insufficient documentation

## 2020-02-17 DIAGNOSIS — R079 Chest pain, unspecified: Secondary | ICD-10-CM | POA: Diagnosis present

## 2020-02-17 DIAGNOSIS — N185 Chronic kidney disease, stage 5: Secondary | ICD-10-CM | POA: Diagnosis not present

## 2020-02-17 DIAGNOSIS — I120 Hypertensive chronic kidney disease with stage 5 chronic kidney disease or end stage renal disease: Secondary | ICD-10-CM | POA: Insufficient documentation
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
## 2020-02-17 NOTE — ED Notes (Signed)
Pt decided to stay.
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
## 2020-02-17 NOTE — ED Notes (Signed)
Pacemaker interrogated by [place] RN. MD Vanita Panda notified
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
## 2020-02-17 NOTE — ED Notes (Signed)
Pt's wife arrived, pt does not wish to stay any longer.

## 2020-08-22 ENCOUNTER — Encounter: Payer: Self-pay | Admitting: Radiology

## 2020-08-22 ENCOUNTER — Emergency Department
Admission: EM | Admit: 2020-08-22 | Discharge: 2020-08-22 | Disposition: A | Discharge status: Home / Self Care | Payer: Medicare Other | Attending: Emergency Medicine | Admitting: Emergency Medicine

## 2020-08-22 ENCOUNTER — Emergency Department: Payer: Medicare Other

## 2020-08-22 DIAGNOSIS — E1122 Type 2 diabetes mellitus with diabetic chronic kidney disease: Secondary | ICD-10-CM | POA: Insufficient documentation

## 2020-08-22 DIAGNOSIS — S3991XA Unspecified injury of abdomen, initial encounter: Secondary | ICD-10-CM | POA: Diagnosis not present

## 2020-08-22 DIAGNOSIS — M549 Dorsalgia, unspecified: Secondary | ICD-10-CM | POA: Diagnosis not present

## 2020-08-22 DIAGNOSIS — N186 End stage renal disease: Secondary | ICD-10-CM | POA: Diagnosis not present

## 2020-08-22 DIAGNOSIS — I700 Atherosclerosis of aorta: Secondary | ICD-10-CM | POA: Insufficient documentation

## 2020-08-22 DIAGNOSIS — Z79899 Other long term (current) drug therapy: Secondary | ICD-10-CM | POA: Diagnosis not present

## 2020-08-22 DIAGNOSIS — M542 Cervicalgia: Secondary | ICD-10-CM | POA: Diagnosis not present

## 2020-08-22 DIAGNOSIS — I120 Hypertensive chronic kidney disease with stage 5 chronic kidney disease or end stage renal disease: Secondary | ICD-10-CM | POA: Insufficient documentation

## 2020-08-22 DIAGNOSIS — Z7984 Long term (current) use of oral hypoglycemic drugs: Secondary | ICD-10-CM | POA: Insufficient documentation

## 2020-08-22 DIAGNOSIS — Z7982 Long term (current) use of aspirin: Secondary | ICD-10-CM | POA: Insufficient documentation

## 2020-08-22 DIAGNOSIS — Y92410 Unspecified street and highway as the place of occurrence of the external cause: Secondary | ICD-10-CM | POA: Diagnosis not present

## 2020-08-22 DIAGNOSIS — R519 Headache, unspecified: Secondary | ICD-10-CM | POA: Insufficient documentation

## 2020-08-22 LAB — CBC WITH DIFFERENTIAL/PLATELET
Abs Immature Granulocytes: 0.03 10*3/uL (ref 0.00–0.07)
Basophils Absolute: 0 10*3/uL (ref 0.0–0.1)
Basophils Relative: 1 %
Eosinophils Absolute: 0.6 10*3/uL — ABNORMAL HIGH (ref 0.0–0.5)
Eosinophils Relative: 10 %
HCT: 32.9 % — ABNORMAL LOW (ref 39.0–52.0)
Hemoglobin: 10.5 g/dL — ABNORMAL LOW (ref 13.0–17.0)
Immature Granulocytes: 1 %
Lymphocytes Relative: 24 %
Lymphs Abs: 1.5 10*3/uL (ref 0.7–4.0)
MCH: 33 pg (ref 26.0–34.0)
MCHC: 31.9 g/dL (ref 30.0–36.0)
MCV: 103.5 fL — ABNORMAL HIGH (ref 80.0–100.0)
Monocytes Absolute: 0.7 10*3/uL (ref 0.1–1.0)
Monocytes Relative: 11 %
Neutro Abs: 3.4 10*3/uL (ref 1.7–7.7)
Neutrophils Relative %: 53 %
Platelets: 131 10*3/uL — ABNORMAL LOW (ref 150–400)
RBC: 3.18 MIL/uL — ABNORMAL LOW (ref 4.22–5.81)
RDW: 15.4 % (ref 11.5–15.5)
WBC: 6.2 10*3/uL (ref 4.0–10.5)
nRBC: 0 % (ref 0.0–0.2)

## 2020-08-22 LAB — BASIC METABOLIC PANEL
Anion gap: 11 (ref 5–15)
BUN: 61 mg/dL — ABNORMAL HIGH (ref 8–23)
CO2: 28 mmol/L (ref 22–32)
Calcium: 9.8 mg/dL (ref 8.9–10.3)
Chloride: 98 mmol/L (ref 98–111)
Creatinine, Ser: 13.95 mg/dL — ABNORMAL HIGH (ref 0.61–1.24)
GFR, Estimated: 3 mL/min — ABNORMAL LOW (ref >60)
Glucose, Bld: 108 mg/dL — ABNORMAL HIGH (ref 70–99)
Potassium: 4.3 mmol/L (ref 3.5–5.1)
Sodium: 137 mmol/L (ref 135–145)

## 2020-08-22 LAB — PROTIME-INR
INR: 1.9 — ABNORMAL HIGH (ref 0.8–1.2)
Prothrombin Time: 21.7 seconds — ABNORMAL HIGH (ref 11.4–15.2)

## 2020-08-22 MED ORDER — IOHEXOL 300 MG/ML  SOLN
100.0000 mL | Freq: Once | INTRAMUSCULAR | Status: AC | PRN
  Administered 2020-08-22: 100 mL via INTRAVENOUS
  Filled 2020-08-22: qty 100

## 2020-08-22 MED ORDER — CYCLOBENZAPRINE HCL 5 MG PO TABS: 5.0000 mg | ORAL_TABLET | Freq: Three times a day (TID) | ORAL | 0 refills | Status: AC | PRN
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
## 2022-01-08 ENCOUNTER — Other Ambulatory Visit: Payer: Self-pay | Admitting: Internal Medicine

## 2022-01-08 ENCOUNTER — Ambulatory Visit
Admission: RE | Admit: 2022-01-08 | Discharge: 2022-01-08 | Disposition: A | Discharge status: Home / Self Care | Payer: Medicare Other | Source: Ambulatory Visit | Attending: Internal Medicine | Admitting: Internal Medicine

## 2022-01-08 DIAGNOSIS — R0602 Shortness of breath: Secondary | ICD-10-CM

## 2022-01-08 DIAGNOSIS — R609 Edema, unspecified: Secondary | ICD-10-CM

## 2022-01-08 DIAGNOSIS — R062 Wheezing: Secondary | ICD-10-CM
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
